# Patient Record
Sex: Female | Born: 1990 | Race: White | Hispanic: No | Marital: Single | Smoking: Never smoker
Health system: Southern US, Community
[De-identification: ages and names within clinical notes are randomized; demographics above are authoritative.]

## PROBLEM LIST (undated history)

## (undated) DIAGNOSIS — F329 Major depressive disorder, single episode, unspecified: Secondary | ICD-10-CM

## (undated) DIAGNOSIS — K219 Gastro-esophageal reflux disease without esophagitis: Secondary | ICD-10-CM

## (undated) DIAGNOSIS — T7840XA Allergy, unspecified, initial encounter: Secondary | ICD-10-CM

## (undated) DIAGNOSIS — F32A Depression, unspecified: Secondary | ICD-10-CM

## (undated) HISTORY — DX: Gastro-esophageal reflux disease without esophagitis: K21.9

## (undated) HISTORY — PX: NO PAST SURGERIES: SHX2092

## (undated) HISTORY — DX: Allergy, unspecified, initial encounter: T78.40XA

## (undated) HISTORY — DX: Depression, unspecified: F32.A

---
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
## 2019-11-06 ENCOUNTER — Other Ambulatory Visit: Payer: Self-pay

## 2019-11-06 ENCOUNTER — Encounter: Payer: Self-pay | Admitting: Physician Assistant

## 2019-11-06 ENCOUNTER — Ambulatory Visit (INDEPENDENT_AMBULATORY_CARE_PROVIDER_SITE_OTHER): Payer: Managed Care, Other (non HMO) | Admitting: Physician Assistant

## 2019-11-06 VITALS — BP 120/82 | HR 99 | Temp 98.6°F | Resp 14 | Ht 63.0 in | Wt 175.0 lb

## 2019-11-06 DIAGNOSIS — N926 Irregular menstruation, unspecified: Secondary | ICD-10-CM

## 2019-11-06 DIAGNOSIS — R5382 Chronic fatigue, unspecified: Secondary | ICD-10-CM | POA: Diagnosis not present

## 2019-11-06 DIAGNOSIS — G8929 Other chronic pain: Secondary | ICD-10-CM

## 2019-11-06 DIAGNOSIS — E538 Deficiency of other specified B group vitamins: Secondary | ICD-10-CM

## 2019-11-06 DIAGNOSIS — E559 Vitamin D deficiency, unspecified: Secondary | ICD-10-CM

## 2019-11-06 DIAGNOSIS — M5442 Lumbago with sciatica, left side: Secondary | ICD-10-CM

## 2019-11-06 DIAGNOSIS — M5441 Lumbago with sciatica, right side: Secondary | ICD-10-CM

## 2019-11-06 LAB — POCT URINE PREGNANCY: Preg Test, Ur: NEGATIVE
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
## 2019-11-06 NOTE — Patient Instructions (Signed)
Please go to the lab today for blood work.  I will call you with your results. We will alter treatment regimen(s) if indicated by your results.   Once we make sure your are not pregnant we will proceed with further imaging and management!  It was very nice meeting you today. Welcome to [facility]!

## 2019-11-07 LAB — COMPREHENSIVE METABOLIC PANEL
ALT: 33 U/L (ref 0–35)
AST: 23 U/L (ref 0–37)
Albumin: 4.8 g/dL (ref 3.5–5.2)
Alkaline Phosphatase: 78 U/L (ref 39–117)
BUN: 11 mg/dL (ref 6–23)
CO2: 29 mEq/L (ref 19–32)
Calcium: 9.8 mg/dL (ref 8.4–10.5)
Chloride: 95 mEq/L — ABNORMAL LOW (ref 96–112)
Creatinine, Ser: 0.51 mg/dL (ref 0.40–1.20)
GFR: 144.31 mL/min (ref >60.00)
Glucose, Bld: 109 mg/dL — ABNORMAL HIGH (ref 70–99)
Potassium: 4.1 mEq/L (ref 3.5–5.1)
Sodium: 135 mEq/L (ref 135–145)
Total Bilirubin: 0.7 mg/dL (ref 0.2–1.2)
Total Protein: 7.6 g/dL (ref 6.0–8.3)

## 2019-11-07 LAB — CBC WITH DIFFERENTIAL/PLATELET
Basophils Absolute: 0.1 10*3/uL (ref 0.0–0.1)
Basophils Relative: 0.9 % (ref 0.0–3.0)
Eosinophils Absolute: 0.1 10*3/uL (ref 0.0–0.7)
Eosinophils Relative: 1.4 % (ref 0.0–5.0)
HCT: 39.7 % (ref 36.0–46.0)
Hemoglobin: 13.9 g/dL (ref 12.0–15.0)
Lymphocytes Relative: 24.1 % (ref 12.0–46.0)
Lymphs Abs: 2.2 10*3/uL (ref 0.7–4.0)
MCHC: 34.9 g/dL (ref 30.0–36.0)
MCV: 84.5 fl (ref 78.0–100.0)
Monocytes Absolute: 0.4 10*3/uL (ref 0.1–1.0)
Monocytes Relative: 4.4 % (ref 3.0–12.0)
Neutro Abs: 6.3 10*3/uL (ref 1.4–7.7)
Neutrophils Relative %: 69.2 % (ref 43.0–77.0)
Platelets: 310 10*3/uL (ref 150.0–400.0)
RBC: 4.7 Mil/uL (ref 3.87–5.11)
RDW: 12.9 % (ref 11.5–15.5)
WBC: 9.1 10*3/uL (ref 4.0–10.5)

## 2019-11-07 LAB — TSH: TSH: 4.62 u[IU]/mL — ABNORMAL HIGH (ref 0.35–4.50)

## 2019-11-07 LAB — VITAMIN D 25 HYDROXY (VIT D DEFICIENCY, FRACTURES): VITD: 13.33 ng/mL — ABNORMAL LOW (ref 30.00–100.00)

## 2019-11-07 LAB — VITAMIN B12: Vitamin B-12: 246 pg/mL (ref 211–911)

## 2019-11-07 LAB — HCG, SERUM, QUALITATIVE: Preg, Serum: NEGATIVE

## 2019-11-08 ENCOUNTER — Other Ambulatory Visit (INDEPENDENT_AMBULATORY_CARE_PROVIDER_SITE_OTHER): Payer: Managed Care, Other (non HMO)

## 2019-11-08 DIAGNOSIS — R5382 Chronic fatigue, unspecified: Secondary | ICD-10-CM | POA: Diagnosis not present

## 2019-11-08 DIAGNOSIS — G9332 Myalgic encephalomyelitis/chronic fatigue syndrome: Secondary | ICD-10-CM

## 2019-11-08 LAB — T4, FREE: Free T4: 0.85 ng/dL (ref 0.60–1.60)

## 2019-11-08 LAB — T3, FREE: T3, Free: 3.9 pg/mL (ref 2.3–4.2)

## 2019-11-09 ENCOUNTER — Other Ambulatory Visit: Payer: Self-pay | Admitting: Emergency Medicine

## 2019-11-09 DIAGNOSIS — G8929 Other chronic pain: Secondary | ICD-10-CM

## 2019-11-09 DIAGNOSIS — M5442 Lumbago with sciatica, left side: Secondary | ICD-10-CM

## 2019-11-09 DIAGNOSIS — E538 Deficiency of other specified B group vitamins: Secondary | ICD-10-CM

## 2019-11-09 DIAGNOSIS — E559 Vitamin D deficiency, unspecified: Secondary | ICD-10-CM

## 2019-11-09 DIAGNOSIS — R7989 Other specified abnormal findings of blood chemistry: Secondary | ICD-10-CM

## 2019-11-09 MED ORDER — VITAMIN B-12 1000 MCG SL SUBL: 1.0000 | SUBLINGUAL_TABLET | Freq: Every day | SUBLINGUAL | 0 refills | Status: AC

## 2019-11-09 MED ORDER — VITAMIN D (ERGOCALCIFEROL) 1.25 MG (50000 UNIT) PO CAPS: ORAL_CAPSULE | ORAL | 3 refills | Status: AC

## 2019-11-09 MED ORDER — VITAMIN D-3 25 MCG (1000 UT) PO CAPS: 1.0000 | ORAL_CAPSULE | Freq: Every day | ORAL | 0 refills | Status: AC

## 2019-11-10 ENCOUNTER — Encounter: Payer: Self-pay | Admitting: Physician Assistant

## 2019-11-24 ENCOUNTER — Other Ambulatory Visit: Payer: Self-pay

## 2019-11-24 ENCOUNTER — Ambulatory Visit (HOSPITAL_BASED_OUTPATIENT_CLINIC_OR_DEPARTMENT_OTHER)
Admission: RE | Admit: 2019-11-24 | Discharge: 2019-11-24 | Disposition: A | Discharge status: Home / Self Care | Payer: Managed Care, Other (non HMO) | Source: Ambulatory Visit | Attending: Physician Assistant | Admitting: Physician Assistant

## 2019-11-24 DIAGNOSIS — M5442 Lumbago with sciatica, left side: Secondary | ICD-10-CM | POA: Insufficient documentation

## 2019-11-24 DIAGNOSIS — M5441 Lumbago with sciatica, right side: Secondary | ICD-10-CM | POA: Insufficient documentation

## 2019-11-24 DIAGNOSIS — G8929 Other chronic pain: Secondary | ICD-10-CM | POA: Insufficient documentation

## 2019-11-27 ENCOUNTER — Other Ambulatory Visit: Payer: Self-pay

## 2019-11-27 DIAGNOSIS — M5442 Lumbago with sciatica, left side: Secondary | ICD-10-CM

## 2020-03-05 ENCOUNTER — Other Ambulatory Visit: Payer: Self-pay | Admitting: Emergency Medicine

## 2020-03-05 ENCOUNTER — Encounter: Payer: Self-pay | Admitting: Physician Assistant

## 2020-03-05 DIAGNOSIS — E559 Vitamin D deficiency, unspecified: Secondary | ICD-10-CM

## 2020-03-05 DIAGNOSIS — E538 Deficiency of other specified B group vitamins: Secondary | ICD-10-CM

## 2020-03-14 ENCOUNTER — Encounter: Payer: Self-pay | Admitting: Physician Assistant

## 2020-05-14 IMAGING — DX DG LUMBAR SPINE COMPLETE 4+V
5 series · 5 of 5 positions shown · non-contrast
Comparison: The patient's prior MRI lumbar spine could not be
retrieved for comparison.

CLINICAL DATA: Chronic low back pain.

EXAM:
LUMBAR SPINE - COMPLETE 4+ VIEW

[l-spine ap]
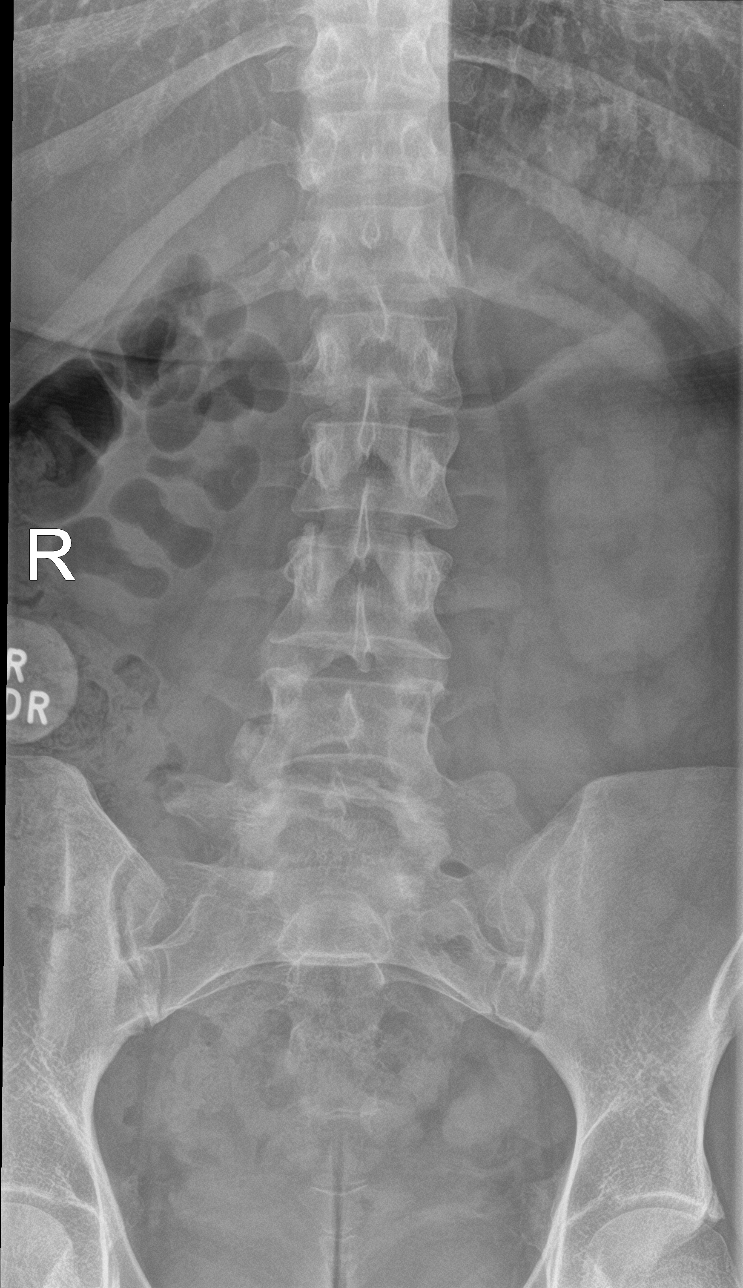

[l-spine obl (1 of 2)]
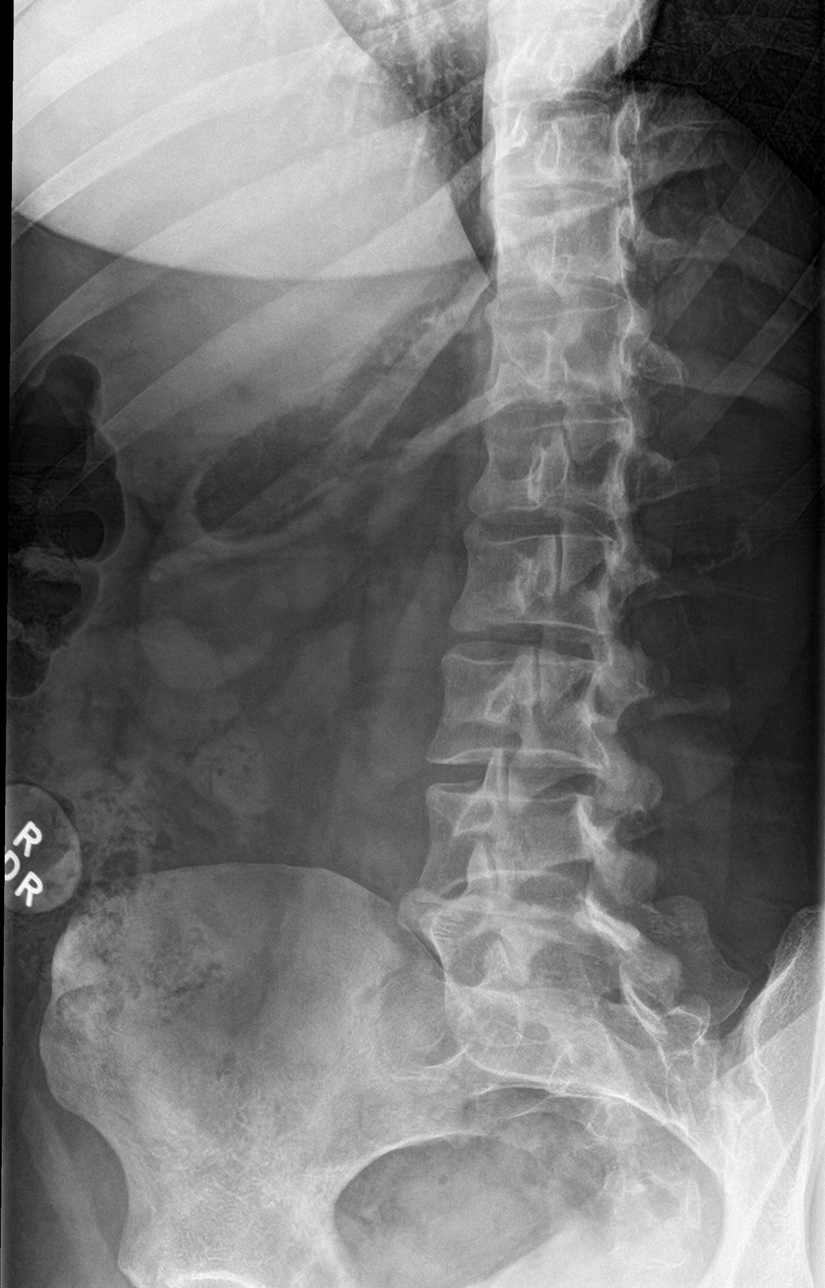

[l-spine obl (2 of 2)]
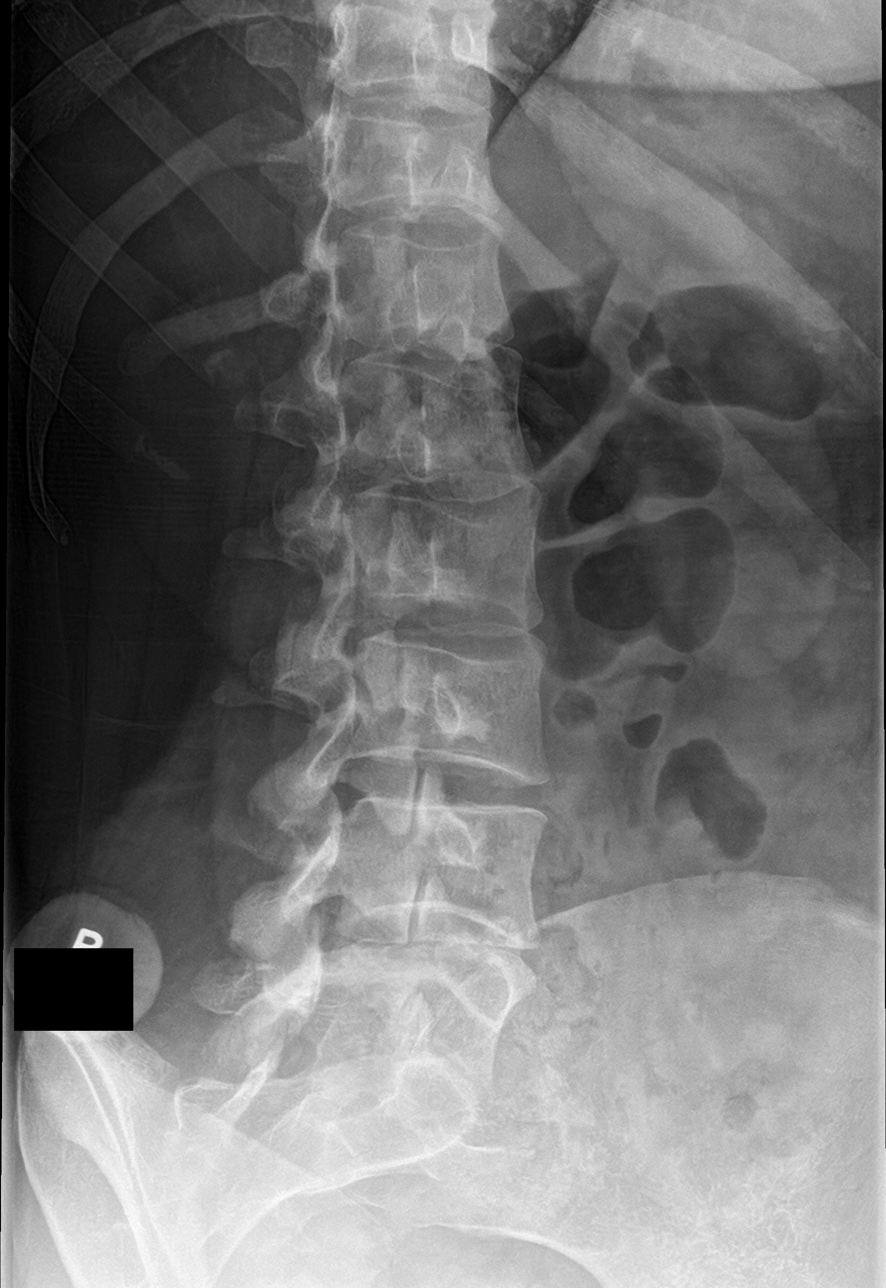

[l-spine lat]
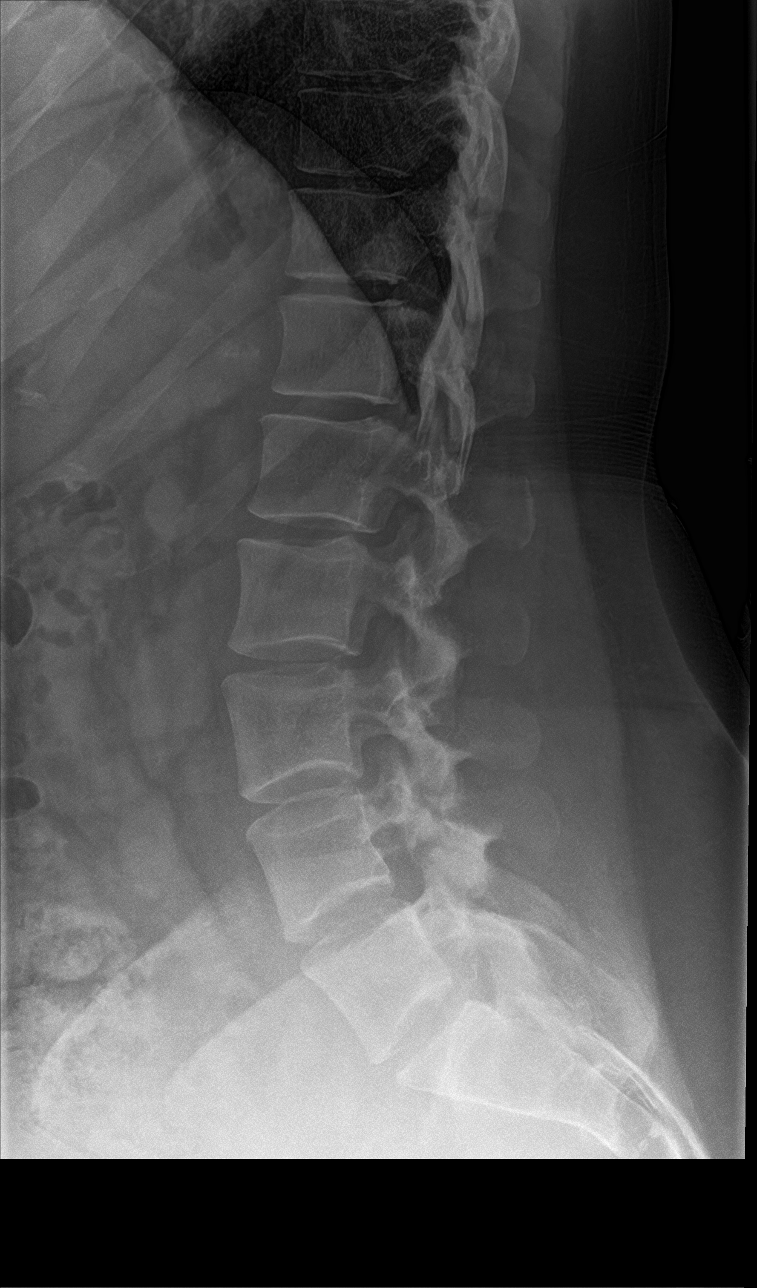

[l-spine spot]
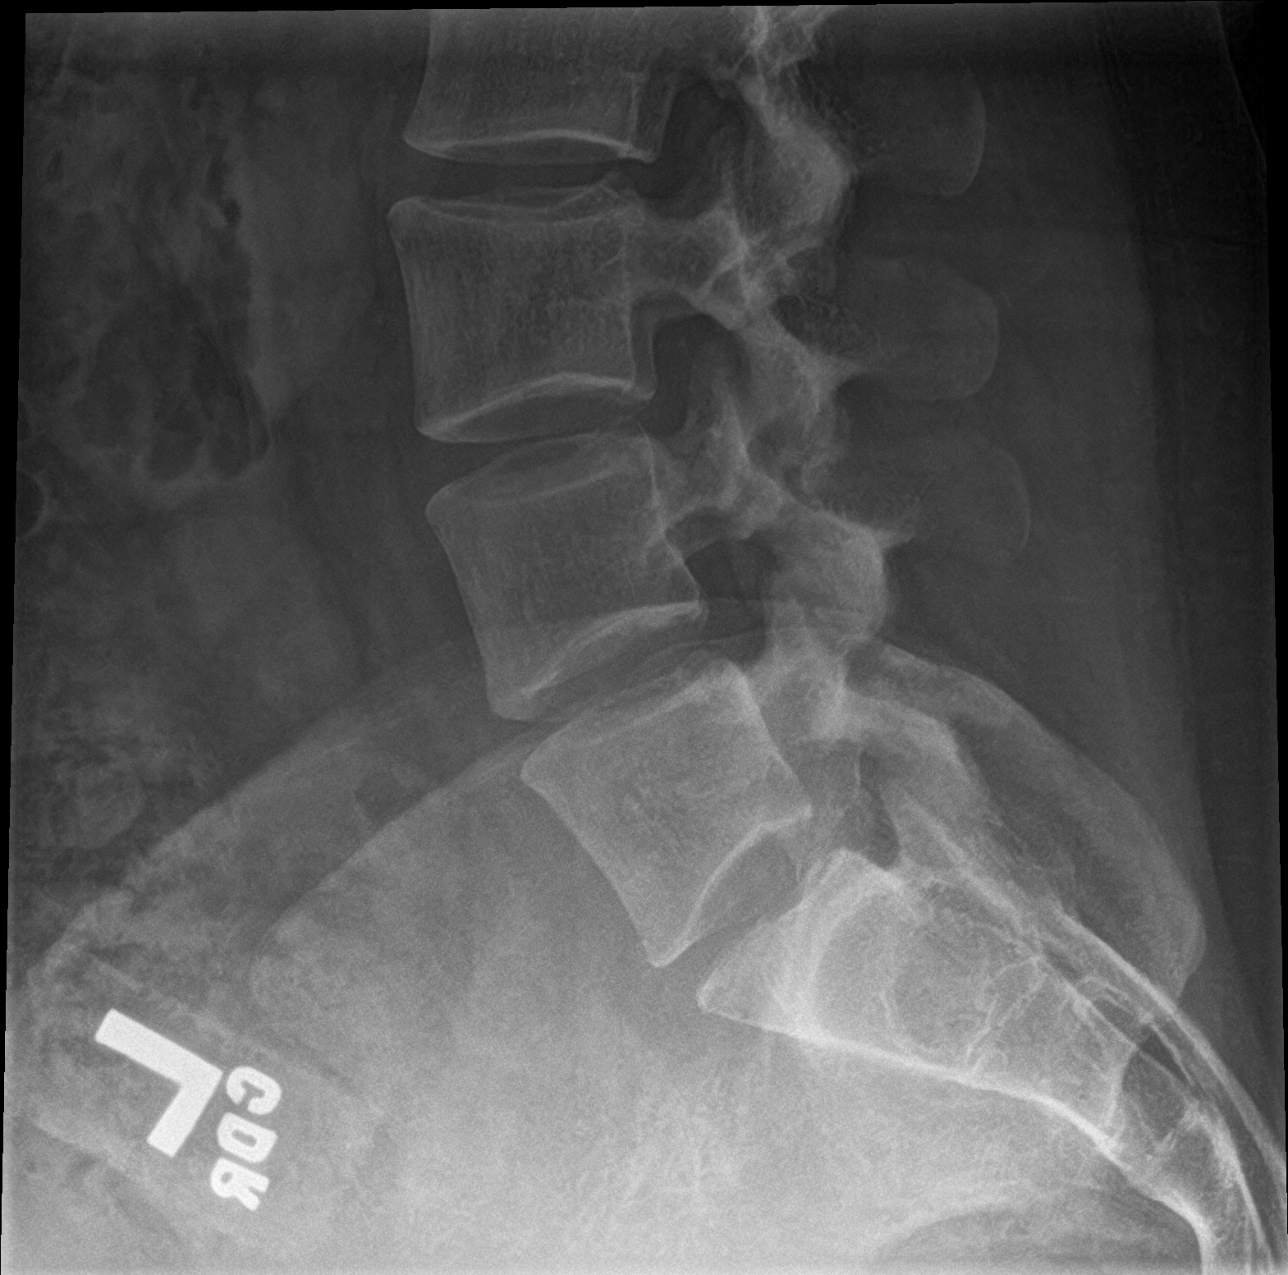

[5 of 5 positions shown; findings below may reference images not displayed]

FINDINGS: There is no evidence of lumbar spine fracture. Alignment is normal.
Intervertebral disc spaces are maintained.
IMPRESSION: Negative.
